# Patient Record
Sex: Female | Born: 1945 | Race: White | Hispanic: No | Marital: Married | Smoking: Current every day smoker
Health system: Southern US, Community
[De-identification: ages and names within clinical notes are randomized; demographics above are authoritative.]

## PROBLEM LIST (undated history)

## (undated) DIAGNOSIS — N189 Chronic kidney disease, unspecified: Secondary | ICD-10-CM

## (undated) DIAGNOSIS — M1990 Unspecified osteoarthritis, unspecified site: Secondary | ICD-10-CM

## (undated) DIAGNOSIS — I10 Essential (primary) hypertension: Secondary | ICD-10-CM

## (undated) DIAGNOSIS — R51 Headache: Secondary | ICD-10-CM

## (undated) HISTORY — PX: BACK SURGERY: SHX140

## (undated) HISTORY — PX: TONSILLECTOMY: SUR1361

## (undated) HISTORY — PX: ANKLE ARTHODESIS W/ ARTHROSCOPY: SUR63

## (undated) HISTORY — DX: Essential (primary) hypertension: I10

## (undated) HISTORY — PX: ABDOMINAL HYSTERECTOMY: SHX81

## (undated) HISTORY — PX: OTHER SURGICAL HISTORY: SHX169

---

## 1999-01-05 ENCOUNTER — Other Ambulatory Visit: Admission: RE | Admit: 1999-01-05 | Discharge: 1999-01-05 | Payer: Self-pay | Admitting: Internal Medicine

## 1999-04-06 ENCOUNTER — Ambulatory Visit (HOSPITAL_COMMUNITY): Admission: RE | Admit: 1999-04-06 | Discharge: 1999-04-06 | Payer: Self-pay | Admitting: Cardiovascular Disease

## 1999-08-12 ENCOUNTER — Encounter: Admission: RE | Admit: 1999-08-12 | Discharge: 1999-08-12 | Payer: Self-pay | Admitting: Internal Medicine

## 1999-08-12 ENCOUNTER — Encounter: Payer: Self-pay | Admitting: Internal Medicine

## 2000-03-21 ENCOUNTER — Other Ambulatory Visit: Admission: RE | Admit: 2000-03-21 | Discharge: 2000-03-21 | Payer: Self-pay | Admitting: Internal Medicine

## 2000-03-23 ENCOUNTER — Encounter: Payer: Self-pay | Admitting: Internal Medicine

## 2000-03-23 ENCOUNTER — Encounter: Admission: RE | Admit: 2000-03-23 | Discharge: 2000-03-23 | Payer: Self-pay | Admitting: Internal Medicine

## 2000-09-04 ENCOUNTER — Encounter: Payer: Self-pay | Admitting: Internal Medicine

## 2000-09-04 ENCOUNTER — Encounter: Admission: RE | Admit: 2000-09-04 | Discharge: 2000-09-04 | Payer: Self-pay | Admitting: Internal Medicine

## 2001-03-26 ENCOUNTER — Other Ambulatory Visit: Admission: RE | Admit: 2001-03-26 | Discharge: 2001-03-26 | Payer: Self-pay | Admitting: Obstetrics and Gynecology

## 2001-09-06 ENCOUNTER — Encounter: Payer: Self-pay | Admitting: Internal Medicine

## 2001-09-06 ENCOUNTER — Encounter: Admission: RE | Admit: 2001-09-06 | Discharge: 2001-09-06 | Payer: Self-pay | Admitting: Internal Medicine

## 2002-06-13 ENCOUNTER — Encounter: Payer: Self-pay | Admitting: Internal Medicine

## 2002-06-13 ENCOUNTER — Encounter: Admission: RE | Admit: 2002-06-13 | Discharge: 2002-06-13 | Payer: Self-pay | Admitting: Internal Medicine

## 2002-07-09 ENCOUNTER — Encounter: Payer: Self-pay | Admitting: Internal Medicine

## 2002-07-09 ENCOUNTER — Encounter: Admission: RE | Admit: 2002-07-09 | Discharge: 2002-07-09 | Payer: Self-pay | Admitting: Internal Medicine

## 2002-07-25 ENCOUNTER — Ambulatory Visit (HOSPITAL_COMMUNITY): Admission: RE | Admit: 2002-07-25 | Discharge: 2002-07-25 | Payer: Self-pay | Admitting: Gastroenterology

## 2002-09-09 ENCOUNTER — Encounter: Admission: RE | Admit: 2002-09-09 | Discharge: 2002-09-09 | Payer: Self-pay | Admitting: Internal Medicine

## 2002-09-09 ENCOUNTER — Encounter: Payer: Self-pay | Admitting: Internal Medicine

## 2003-05-15 ENCOUNTER — Other Ambulatory Visit: Admission: RE | Admit: 2003-05-15 | Discharge: 2003-05-15 | Payer: Self-pay | Admitting: Obstetrics and Gynecology

## 2003-05-21 ENCOUNTER — Encounter: Admission: RE | Admit: 2003-05-21 | Discharge: 2003-05-21 | Payer: Self-pay | Admitting: Internal Medicine

## 2003-09-30 ENCOUNTER — Encounter: Admission: RE | Admit: 2003-09-30 | Discharge: 2003-09-30 | Payer: Self-pay | Admitting: Obstetrics and Gynecology

## 2004-05-15 ENCOUNTER — Emergency Department (HOSPITAL_COMMUNITY): Admission: EM | Admit: 2004-05-15 | Discharge: 2004-05-15 | Payer: Self-pay | Admitting: Emergency Medicine

## 2004-05-18 ENCOUNTER — Other Ambulatory Visit: Admission: RE | Admit: 2004-05-18 | Discharge: 2004-05-18 | Payer: Self-pay | Admitting: Obstetrics and Gynecology

## 2004-05-27 ENCOUNTER — Encounter: Admission: RE | Admit: 2004-05-27 | Discharge: 2004-05-27 | Payer: Self-pay | Admitting: Family Medicine

## 2005-06-17 ENCOUNTER — Encounter: Admission: RE | Admit: 2005-06-17 | Discharge: 2005-06-17 | Payer: Self-pay | Admitting: Internal Medicine

## 2006-02-23 ENCOUNTER — Other Ambulatory Visit: Admission: RE | Admit: 2006-02-23 | Discharge: 2006-02-23 | Payer: Self-pay | Admitting: Obstetrics and Gynecology

## 2006-09-05 ENCOUNTER — Encounter: Admission: RE | Admit: 2006-09-05 | Discharge: 2006-09-05 | Payer: Self-pay | Admitting: Obstetrics and Gynecology

## 2006-12-10 ENCOUNTER — Inpatient Hospital Stay (HOSPITAL_COMMUNITY): Admission: EM | Admit: 2006-12-10 | Discharge: 2006-12-12 | Payer: Self-pay | Admitting: *Deleted

## 2007-02-26 ENCOUNTER — Other Ambulatory Visit: Admission: RE | Admit: 2007-02-26 | Discharge: 2007-02-26 | Payer: Self-pay | Admitting: Obstetrics and Gynecology

## 2007-10-02 ENCOUNTER — Encounter: Admission: RE | Admit: 2007-10-02 | Discharge: 2007-10-02 | Payer: Self-pay | Admitting: Obstetrics and Gynecology

## 2007-10-04 ENCOUNTER — Encounter: Admission: RE | Admit: 2007-10-04 | Discharge: 2007-10-04 | Payer: Self-pay | Admitting: Obstetrics and Gynecology

## 2008-02-27 ENCOUNTER — Other Ambulatory Visit: Admission: RE | Admit: 2008-02-27 | Discharge: 2008-02-27 | Payer: Self-pay | Admitting: Obstetrics and Gynecology

## 2008-10-17 ENCOUNTER — Encounter: Admission: RE | Admit: 2008-10-17 | Discharge: 2008-10-17 | Payer: Self-pay | Admitting: Obstetrics and Gynecology

## 2009-02-17 ENCOUNTER — Encounter: Admission: RE | Admit: 2009-02-17 | Discharge: 2009-02-17 | Payer: Self-pay | Admitting: Internal Medicine

## 2009-03-04 ENCOUNTER — Other Ambulatory Visit: Admission: RE | Admit: 2009-03-04 | Discharge: 2009-03-04 | Payer: Self-pay | Admitting: Obstetrics and Gynecology

## 2009-04-15 ENCOUNTER — Encounter: Admission: RE | Admit: 2009-04-15 | Discharge: 2009-04-15 | Payer: Self-pay | Admitting: Internal Medicine

## 2009-05-07 ENCOUNTER — Ambulatory Visit (HOSPITAL_COMMUNITY): Admission: RE | Admit: 2009-05-07 | Discharge: 2009-05-08 | Payer: Self-pay | Admitting: Neurosurgery

## 2009-05-07 ENCOUNTER — Encounter (INDEPENDENT_AMBULATORY_CARE_PROVIDER_SITE_OTHER): Payer: Self-pay | Admitting: Neurosurgery

## 2010-02-17 ENCOUNTER — Encounter: Admission: RE | Admit: 2010-02-17 | Discharge: 2010-02-17 | Payer: Self-pay | Admitting: Internal Medicine

## 2010-07-11 LAB — CBC
Hemoglobin: 14.2 g/dL (ref 12.0–15.0)
MCHC: 34.7 g/dL (ref 30.0–36.0)
MCV: 96 fL (ref 78.0–100.0)
RBC: 4.26 MIL/uL (ref 3.87–5.11)
WBC: 9.5 10*3/uL (ref 4.0–10.5)

## 2010-07-11 LAB — URINALYSIS, ROUTINE W REFLEX MICROSCOPIC
Leukocytes, UA: NEGATIVE
Nitrite: NEGATIVE
Specific Gravity, Urine: 1.006 (ref 1.005–1.030)
Urobilinogen, UA: 0.2 mg/dL (ref 0.0–1.0)
pH: 7 (ref 5.0–8.0)

## 2010-07-11 LAB — BASIC METABOLIC PANEL
CO2: 27 mEq/L (ref 19–32)
Chloride: 100 mEq/L (ref 96–112)
Creatinine, Ser: 0.61 mg/dL (ref 0.4–1.2)
GFR calc Af Amer: >60 mL/min (ref >60)
Sodium: 135 mEq/L (ref 135–145)

## 2010-07-11 LAB — DIFFERENTIAL
Basophils Relative: 0 % (ref 0–1)
Lymphs Abs: 3.3 10*3/uL (ref 0.7–4.0)
Monocytes Absolute: 0.8 10*3/uL (ref 0.1–1.0)
Monocytes Relative: 8 % (ref 3–12)
Neutro Abs: 5.3 10*3/uL (ref 1.7–7.7)
Neutrophils Relative %: 56 % (ref 43–77)

## 2010-07-11 LAB — URINE MICROSCOPIC-ADD ON

## 2010-07-11 LAB — PROTIME-INR: INR: 0.91 (ref 0.00–1.49)

## 2010-07-11 LAB — APTT: aPTT: 30 seconds (ref 24–37)

## 2010-09-07 NOTE — Op Note (Signed)
Wendy Ortega, Wendy Ortega               ACCOUNT NO.:  [id number]   MEDICAL RECORD NO.:  [id number]          PATIENT TYPE:  INP   LOCATION:  1617                         FACILITY:  [facility]   PHYSICIAN:  Almedia Balls. Ranell Patrick, M.D. DATE OF BIRTH:  1945-05-02   DATE OF PROCEDURE:  12/11/2006  DATE OF DISCHARGE:                               OPERATIVE REPORT   PREOPERATIVE DIAGNOSIS:  Right displaced ankle fracture.   POSTOPERATIVE DIAGNOSIS:  Right displaced ankle fracture.   PROCEDURE PERFORMED:  Open reduction and internal fixation of right  bimalleolar ankle fracture.   ATTENDING SURGEON:  Almedia Balls. Ranell Patrick, M.D.   ASSISTANT:  Donnie Coffin. Dixon, P.A.-C.   ANESTHESIA:  General anesthesia was used.   ESTIMATED BLOOD LOSS:  Zero.   TOURNIQUET TIME:  46 minutes at 300 mmHg.   INSTRUMENT COUNTS:  Correct.   COMPLICATIONS:  There were no complications.   Perioperative antibiotics were given.   INDICATIONS:  The patient is a 65 year old female who suffered a  fracture dislocation of her right ankle  The patient presented to [facility] Emergency Room for evaluation and was noted to have a displaced  fibular fracture with a posterior malleolar fracture and a subluxed,  almost dislocated ankle.  The patient was closed reduced in the  emergency room and was placed in a splint and now presents for  definitive fixation of her unstable ankle fracture.  Informed consent  was obtained.   DESCRIPTION OF PROCEDURE:  After an adequate level of anesthesia was  achieved, the patient was placed on a regulation operating room table.  Nonsterile tourniquet was placed on the right proximal thigh.  The right  leg was thoroughly prepped and draped in the usual manner.  After  exsanguination of the limb using an Esmarch bandage, we elevated the  tourniquet to 300 mmHg.  A longitudinal lateral incision was carried out  over the distal fibula.  Dissection was carried sharply down through  subcutaneous tissues.   The sural nerve was identified as taking an  anomalous course.  This coursed directly across the fracture site and  across the incision, going anteriorly.  This was protected anteriorly  during the surgery.  We identified the fracture site, cleaned it of soft  tissue debris, thoroughly irrigated, removed soft tissue from the  anterolateral gutter of the ankle joint.  We then reduced the fracture  under direct visualization, placing a crab-claw clamp to hold the  fracture provisionally in place.  We then placed an anterior-to-  posterior compression screw using AO technique using a 3.5 mm cortex  screw, overdrilling the proximal fragment.  With this in place, we  placed an 8-hole, one-third tubular plate on the lateral side of the  fibula to serve as a neutralization plate.  We had three cortical screws  proximally and three cancellous screws distally for good  purchase.  We then thoroughly irrigated, checked final x-rays both in  the AP and lateral plane.  Happy with the position of the mortise and  the hardware in the fracture site, we went ahead and closed the wound  with a layered closure with 2-0 Vicryl followed by staples applied.  The  patient was taken to the recovery room in stable condition.      Almedia Balls. Ranell Patrick, M.D.  Electronically Signed     SRN/MEDQ  D:  12/11/2006  T:  12/12/2006  Job:  [id number]

## 2010-09-07 NOTE — Discharge Summary (Signed)
Wendy Ortega, Wendy Ortega               ACCOUNT NO.:  [id number]   MEDICAL RECORD NO.:  [id number]          PATIENT TYPE:  INP   LOCATION:  1617                         FACILITY:  [facility]   PHYSICIAN:  Almedia Balls. Ranell Patrick, M.D. DATE OF BIRTH:  11/10/1945   DATE OF ADMISSION:  12/10/2006  DATE OF DISCHARGE:  12/12/2006                               DISCHARGE SUMMARY   ADMISSION DIAGNOSIS:  Right displaced fibular fracture.   DISCHARGE DIAGNOSIS:  Right displaced ankle fracture, status post open  reduction internal fixation.   HISTORY OF PRESENT ILLNESS:  The patient is a 65 year old female who  suffered a fracture dislocation of her right ankle on August 17, after  slipping and falling and having a foot inversion.  The patient was seen  in the emergency department and admitted for surgical management of this  right ankle.   PROCEDURE:  The patient had a right ankle open reduction internal  fixation by Dr. Malon Kindle, on December 11, 2006, assistant Standley Dakins, PA-C.  General anesthesia was used.  Estimated blood loss was  minimal.  No complications.   HOSPITAL COURSE:  The patient admitted on December 10, 2006, after  sustaining a fracture dislocation.  The patient did have a closed  reduction in the emergency department with greatly improved fibular  fracture, but still unstable ankle thus needing surgical management for  fixation.  The patient had the above-stated procedure on December 11, 2006, which she tolerated well.  After adequate time in the post  anesthesia care unit, she was transferred up to [address].  On postop day  #1, the patient complained about some mild pain to the right ankle, but  was able to progress with physical therapy to work with a walker before  discharge home.  The patient is to be discharged home on December 12, 2006.   DIET:  Diet is regular.   CONDITION ON DISCHARGE:  Stable.   FOLLOW UP:  The patient is to follow back up with Dr. Malon Kindle in 2  weeks.   ALLERGIES:  TETRACYCLINE AND ERYTHROMYCIN.   DISCHARGE MEDICATIONS:  1. Percocet 5/325 1-2 tablets q.4-6 h. p.r.n. pain.  2. Robaxin 500 mg 1 tablet p.o. q.6 h.      Thomas B. Durwin Nora, P.A.      Almedia Balls. Ranell Patrick, M.D.  Electronically Signed    TBD/MEDQ  D:  12/12/2006  T:  12/12/2006  Job:  [id number]

## 2011-01-13 ENCOUNTER — Other Ambulatory Visit: Payer: Self-pay | Admitting: Internal Medicine

## 2011-01-13 DIAGNOSIS — Z1231 Encounter for screening mammogram for malignant neoplasm of breast: Secondary | ICD-10-CM

## 2011-02-04 LAB — CBC
HCT: 39
Hemoglobin: 13.6
MCV: 93.7
RBC: 4.17
WBC: 8.4

## 2011-02-04 LAB — URINE MICROSCOPIC-ADD ON

## 2011-02-04 LAB — DIFFERENTIAL
Eosinophils Absolute: 0.1
Eosinophils Relative: 1
Lymphs Abs: 2.5
Monocytes Relative: 6

## 2011-02-04 LAB — URINALYSIS, ROUTINE W REFLEX MICROSCOPIC
Bilirubin Urine: NEGATIVE
Specific Gravity, Urine: 1.009
Urobilinogen, UA: 0.2
pH: 6.5

## 2011-02-04 LAB — BASIC METABOLIC PANEL
Chloride: 107
GFR calc Af Amer: >60
Potassium: 4.4

## 2011-02-04 LAB — PROTIME-INR
INR: 0.9
Prothrombin Time: 11.8

## 2011-02-04 LAB — APTT: aPTT: 30

## 2011-02-23 ENCOUNTER — Ambulatory Visit
Admission: RE | Admit: 2011-02-23 | Discharge: 2011-02-23 | Disposition: A | Discharge status: Home / Self Care | Payer: BC Managed Care – PPO | Source: Ambulatory Visit | Attending: Internal Medicine | Admitting: Internal Medicine

## 2011-02-23 DIAGNOSIS — Z1231 Encounter for screening mammogram for malignant neoplasm of breast: Secondary | ICD-10-CM

## 2011-05-26 ENCOUNTER — Other Ambulatory Visit: Payer: Self-pay | Admitting: Dermatology

## 2011-06-06 ENCOUNTER — Other Ambulatory Visit: Payer: Self-pay | Admitting: Dermatology

## 2011-12-07 ENCOUNTER — Other Ambulatory Visit: Payer: Self-pay | Admitting: Dermatology

## 2012-04-10 ENCOUNTER — Other Ambulatory Visit: Payer: Self-pay | Admitting: Internal Medicine

## 2012-04-10 DIAGNOSIS — Z1231 Encounter for screening mammogram for malignant neoplasm of breast: Secondary | ICD-10-CM

## 2012-04-10 DIAGNOSIS — R3129 Other microscopic hematuria: Secondary | ICD-10-CM

## 2012-04-30 ENCOUNTER — Ambulatory Visit
Admission: RE | Admit: 2012-04-30 | Discharge: 2012-04-30 | Disposition: A | Discharge status: Home / Self Care | Payer: Medicare Other | Source: Ambulatory Visit | Attending: Internal Medicine | Admitting: Internal Medicine

## 2012-04-30 DIAGNOSIS — R3129 Other microscopic hematuria: Secondary | ICD-10-CM

## 2012-05-21 ENCOUNTER — Ambulatory Visit
Admission: RE | Admit: 2012-05-21 | Discharge: 2012-05-21 | Disposition: A | Discharge status: Home / Self Care | Payer: Medicare Other | Source: Ambulatory Visit | Attending: Internal Medicine | Admitting: Internal Medicine

## 2012-05-21 DIAGNOSIS — Z1231 Encounter for screening mammogram for malignant neoplasm of breast: Secondary | ICD-10-CM

## 2013-09-18 ENCOUNTER — Other Ambulatory Visit: Payer: Self-pay | Admitting: Gastroenterology

## 2013-09-30 ENCOUNTER — Encounter (INDEPENDENT_AMBULATORY_CARE_PROVIDER_SITE_OTHER): Payer: Self-pay

## 2013-09-30 ENCOUNTER — Other Ambulatory Visit: Payer: Self-pay | Admitting: Internal Medicine

## 2013-09-30 ENCOUNTER — Ambulatory Visit
Admission: RE | Admit: 2013-09-30 | Discharge: 2013-09-30 | Disposition: A | Discharge status: Home / Self Care | Payer: Medicare Other | Source: Ambulatory Visit | Attending: Internal Medicine | Admitting: Internal Medicine

## 2013-09-30 DIAGNOSIS — F17200 Nicotine dependence, unspecified, uncomplicated: Secondary | ICD-10-CM

## 2013-10-01 ENCOUNTER — Encounter (INDEPENDENT_AMBULATORY_CARE_PROVIDER_SITE_OTHER): Payer: Self-pay | Admitting: General Surgery

## 2013-10-01 ENCOUNTER — Ambulatory Visit (INDEPENDENT_AMBULATORY_CARE_PROVIDER_SITE_OTHER): Payer: Medicare Other | Admitting: General Surgery

## 2013-10-01 VITALS — BP 126/78 | HR 64 | Temp 97.8°F | Ht 64.0 in | Wt 161.0 lb

## 2013-10-01 DIAGNOSIS — K645 Perianal venous thrombosis: Secondary | ICD-10-CM | POA: Insufficient documentation

## 2013-10-01 NOTE — Progress Notes (Signed)
Patient ID: Wendy Ortega, female   DOB: 10-Aug-1945, 68 y.o.   MRN: [id number] Subjective:    HPI The patient comes in with a chronically thrombosed external hemorrhoid.  Review of Systems No bleeding. Occasional itching. No pain.    Objective:   Physical Exam On examination at approximately the 2:00 position with 12:00 being directly posterior the patient has a chronically thrombosed external hemorrhoid.  On anoscopy there was no associated internal hemorrhoid.     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     Plan:     Sitz baths at least twice a day. Reassessment in one month if she continues to be symptomatic becomes more symptomatic.

## 2013-10-01 NOTE — Progress Notes (Deleted)
Subjective:     Patient ID: Wendy Ortega, female   DOB: 1945-08-28, 68 y.o.   MRN: [id number]  HPI   Review of Systems     Objective:   Physical Exam     Assessment:     ***    Plan:     ***

## 2013-11-18 ENCOUNTER — Encounter (HOSPITAL_COMMUNITY): Payer: Self-pay | Admitting: Pharmacy Technician

## 2013-11-28 ENCOUNTER — Encounter (HOSPITAL_COMMUNITY): Payer: Self-pay | Admitting: *Deleted

## 2013-12-10 ENCOUNTER — Ambulatory Visit (HOSPITAL_COMMUNITY)
Admission: RE | Admit: 2013-12-10 | Discharge: 2013-12-10 | Disposition: A | Discharge status: Home / Self Care | Payer: Medicare Other | Source: Ambulatory Visit | Attending: Gastroenterology | Admitting: Gastroenterology

## 2013-12-10 ENCOUNTER — Encounter (HOSPITAL_COMMUNITY): Payer: Self-pay | Admitting: Registered Nurse

## 2013-12-10 ENCOUNTER — Ambulatory Visit (HOSPITAL_COMMUNITY): Payer: Medicare Other | Admitting: Registered Nurse

## 2013-12-10 ENCOUNTER — Encounter (HOSPITAL_COMMUNITY): Payer: Medicare Other | Admitting: Registered Nurse

## 2013-12-10 ENCOUNTER — Encounter (HOSPITAL_COMMUNITY)
Admission: RE | Disposition: A | Discharge status: Home / Self Care | Payer: Self-pay | Source: Ambulatory Visit | Attending: Gastroenterology

## 2013-12-10 DIAGNOSIS — I10 Essential (primary) hypertension: Secondary | ICD-10-CM | POA: Insufficient documentation

## 2013-12-10 DIAGNOSIS — Z83719 Family history of colon polyps, unspecified: Secondary | ICD-10-CM | POA: Insufficient documentation

## 2013-12-10 DIAGNOSIS — D126 Benign neoplasm of colon, unspecified: Secondary | ICD-10-CM | POA: Diagnosis not present

## 2013-12-10 DIAGNOSIS — Z90710 Acquired absence of both cervix and uterus: Secondary | ICD-10-CM | POA: Insufficient documentation

## 2013-12-10 DIAGNOSIS — Z8371 Family history of colonic polyps: Secondary | ICD-10-CM | POA: Insufficient documentation

## 2013-12-10 DIAGNOSIS — Z79899 Other long term (current) drug therapy: Secondary | ICD-10-CM | POA: Insufficient documentation

## 2013-12-10 DIAGNOSIS — Z800 Family history of malignant neoplasm of digestive organs: Secondary | ICD-10-CM | POA: Insufficient documentation

## 2013-12-10 DIAGNOSIS — Z1211 Encounter for screening for malignant neoplasm of colon: Secondary | ICD-10-CM | POA: Diagnosis not present

## 2013-12-10 DIAGNOSIS — F17200 Nicotine dependence, unspecified, uncomplicated: Secondary | ICD-10-CM | POA: Insufficient documentation

## 2013-12-10 HISTORY — DX: Headache: R51

## 2013-12-10 HISTORY — DX: Chronic kidney disease, unspecified: N18.9

## 2013-12-10 HISTORY — DX: Unspecified osteoarthritis, unspecified site: M19.90

## 2013-12-10 HISTORY — PX: COLONOSCOPY WITH PROPOFOL: SHX5780

## 2013-12-10 SURGERY — COLONOSCOPY WITH PROPOFOL
Anesthesia: Monitor Anesthesia Care

## 2013-12-10 MED ORDER — LIDOCAINE HCL (CARDIAC) 20 MG/ML IV SOLN
INTRAVENOUS | Status: AC
  Filled 2013-12-10: qty 5

## 2013-12-10 MED ORDER — ATROPINE SULFATE 0.4 MG/ML IJ SOLN
INTRAMUSCULAR | Status: AC
  Filled 2013-12-10: qty 2

## 2013-12-10 MED ORDER — PROPOFOL 10 MG/ML IV BOLUS
INTRAVENOUS | Status: DC | PRN
  Administered 2013-12-10 (×3): 20 mg via INTRAVENOUS

## 2013-12-10 MED ORDER — LACTATED RINGERS IV SOLN
INTRAVENOUS | Status: DC | PRN
  Administered 2013-12-10: 08:00:00 via INTRAVENOUS

## 2013-12-10 MED ORDER — LIDOCAINE HCL (CARDIAC) 20 MG/ML IV SOLN
INTRAVENOUS | Status: DC | PRN
  Administered 2013-12-10: 100 mg via INTRAVENOUS

## 2013-12-10 MED ORDER — MIDAZOLAM HCL 5 MG/5ML IJ SOLN
INTRAMUSCULAR | Status: DC | PRN
  Administered 2013-12-10: 2 mg via INTRAVENOUS

## 2013-12-10 MED ORDER — EPHEDRINE SULFATE 50 MG/ML IJ SOLN
INTRAMUSCULAR | Status: AC
  Filled 2013-12-10: qty 1

## 2013-12-10 MED ORDER — SODIUM CHLORIDE 0.9 % IV SOLN: INTRAVENOUS | Status: DC

## 2013-12-10 MED ORDER — PROPOFOL INFUSION 10 MG/ML OPTIME
INTRAVENOUS | Status: DC | PRN
  Administered 2013-12-10: 75 ug/kg/min via INTRAVENOUS

## 2013-12-10 MED ORDER — MIDAZOLAM HCL 2 MG/2ML IJ SOLN
INTRAMUSCULAR | Status: AC
  Filled 2013-12-10: qty 2

## 2013-12-10 MED ORDER — PROPOFOL 10 MG/ML IV BOLUS
INTRAVENOUS | Status: AC
  Filled 2013-12-10: qty 20

## 2013-12-10 MED ORDER — SODIUM CHLORIDE 0.9 % IJ SOLN
INTRAMUSCULAR | Status: AC
  Filled 2013-12-10: qty 10

## 2013-12-10 SURGICAL SUPPLY — 22 items

## 2013-12-10 NOTE — Op Note (Signed)
Procedure: Screening colonoscopy. Colonoscopy with removal of a 7 mm colon polyp in 2000. Normal screening colonoscopies performed in 2004 and 2009. Father diagnosed with colon cancer after age 67. Brother diagnosed with colon polyps.  Endoscopist: Earle Gell  Premedication: Propofol administered by anesthesia  Procedure: The patient was placed in the left lateral decubitus position. Anal inspection and digital rectal exam were normal. The Pentax pediatric colonoscope was introduced into the rectum and advanced to the cecum. A normal-appearing appendiceal orifice and ileocecal valve were identified. Colonic preparation for the exam today was good.  Rectum. Normal. Retroflexed view of the distal rectum normal  Sigmoid colon and descending colon. Normal  Splenic flexure. Normal  Transverse colon. From the mid transverse colon, a 3 mm sessile polyp was removed with the cold snare, a 5 mm sessile polyp was removed with the cold snare, and a 6 mm sessile polyp was removed with the hot snare.  Hepatic flexure. Normal  Ascending colon. From the proximal ascending colon, a 5 mm sessile polyp was removed with the cold snare  Cecum and ileocecal valve. Normal  Assessment: A 5 mm sessile polyp was removed from the proximal ascending colon and three small sessile polyps were removed from the mid transverse colon.  Recommendation: Schedule repeat colonoscopy in 5 years.

## 2013-12-10 NOTE — Discharge Instructions (Signed)
Colonoscopy, Care After °These instructions give you information on caring for yourself after your procedure. Your doctor may also give you more specific instructions. Call your doctor if you have any problems or questions after your procedure. °HOME CARE °· Do not drive for 24 hours. °· Do not sign important papers or use machinery for 24 hours. °· You may shower. °· You may go back to your usual activities, but go slower for the first 24 hours. °· Take rest breaks often during the first 24 hours. °· Walk around or use warm packs on your belly (abdomen) if you have belly cramping or gas. °· Drink enough fluids to keep your pee (urine) clear or pale yellow. °· Resume your normal diet. Avoid heavy or fried foods. °· Avoid drinking alcohol for 24 hours or as told by your doctor. °· Only take medicines as told by your doctor. °If a tissue sample (biopsy) was taken during the procedure:  °· Do not take aspirin or blood thinners for 7 days, or as told by your doctor. °· Do not drink alcohol for 7 days, or as told by your doctor. °· Eat soft foods for the first 24 hours. °GET HELP IF: °You still have a small amount of blood in your poop (stool) 2-3 days after the procedure. °GET HELP RIGHT AWAY IF: °· You have more than a small amount of blood in your poop. °· You see clumps of tissue (blood clots) in your poop. °· Your belly is puffy (swollen). °· You feel sick to your stomach (nauseous) or throw up (vomit). °· You have a fever. °· You have belly pain that gets worse and medicine does not help. °MAKE SURE YOU: °· Understand these instructions. °· Will watch your condition. °· Will get help right away if you are not doing well or get worse. °Document Released: 05/14/2010 Document Revised: 04/16/2013 Document Reviewed: 12/17/2012 °Exit[employer]® Patient Information ©2015 [employer]. This information is not intended to replace advice given to you by your health care provider. Make sure you discuss any questions you have with  your health care provider. ° °

## 2013-12-10 NOTE — Anesthesia Postprocedure Evaluation (Signed)
  Anesthesia Post-op Note  Patient: Wendy Ortega  Procedure(s) Performed: Procedure(s) (LRB): COLONOSCOPY WITH PROPOFOL (N/A)  Patient Location: PACU  Anesthesia Type: MAC  Level of Consciousness: awake and alert   Airway and Oxygen Therapy: Patient Spontanous Breathing  Post-op Pain: mild  Post-op Assessment: Post-op Vital signs reviewed, Patient's Cardiovascular Status Stable, Respiratory Function Stable, Patent Airway and No signs of Nausea or vomiting  Last Vitals:  Filed Vitals:   12/10/13 0840  BP: 126/63  Pulse: 57  Temp: 36.6 C  Resp: 17    Post-op Vital Signs: stable   Complications: No apparent anesthesia complications

## 2013-12-10 NOTE — Anesthesia Preprocedure Evaluation (Addendum)
Anesthesia Evaluation  Patient identified by MRN, date of birth, ID band Patient awake    Reviewed: Allergy & Precautions, H&P , NPO status , Patient's Chart, lab work & pertinent test results  Airway Mallampati: II TM Distance: >3 FB Neck ROM: Full    Dental no notable dental hx.    Pulmonary Current Smoker,  breath sounds clear to auscultation  Pulmonary exam normal       Cardiovascular hypertension, Pt. on medications Rhythm:Regular Rate:Normal     Neuro/Psych negative neurological ROS  negative psych ROS   GI/Hepatic negative GI ROS, Neg liver ROS,   Endo/Other  negative endocrine ROS  Renal/GU negative Renal ROS  negative genitourinary   Musculoskeletal negative musculoskeletal ROS (+)   Abdominal   Peds negative pediatric ROS (+)  Hematology negative hematology ROS (+)   Anesthesia Other Findings   Reproductive/Obstetrics negative OB ROS                         Anesthesia Physical Anesthesia Plan  ASA: II  Anesthesia Plan: MAC   Post-op Pain Management:    Induction: Intravenous  Airway Management Planned: Nasal Cannula and Simple Face Mask  Additional Equipment:   Intra-op Plan:   Post-operative Plan:   Informed Consent: I have reviewed the patients History and Physical, chart, labs and discussed the procedure including the risks, benefits and alternatives for the proposed anesthesia with the patient or authorized representative who has indicated his/her understanding and acceptance.   Dental advisory given  Plan Discussed with: CRNA and Surgeon  Anesthesia Plan Comments:        Anesthesia Quick Evaluation

## 2013-12-10 NOTE — Transfer of Care (Signed)
Immediate Anesthesia Transfer of Care Note  Patient: Wendy Ortega  Procedure(s) Performed: Procedure(s): COLONOSCOPY WITH PROPOFOL (N/A)  Patient Location: PACU and Endoscopy Unit  Anesthesia Type:MAC  Level of Consciousness: awake, alert , oriented and patient cooperative  Airway & Oxygen Therapy: Patient Spontanous Breathing and Patient connected to face mask oxygen  Post-op Assessment: Report given to PACU RN, Post -op Vital signs reviewed and stable and Patient moving all extremities  Post vital signs: Reviewed and stable  Complications: No apparent anesthesia complications

## 2013-12-10 NOTE — H&P (Signed)
  Procedure: Repeat screening colonoscopy. Colonoscopy with removal of a 7 mm colon polyp in 2000. Normal screening colonoscopies in 2004 and 2009. Father diagnosed with colon cancer after age 67. Brother has had colon polyps removed.  History: The patient is a 67 year old female born for/14/1947. She is scheduled to undergo a screening colonoscopy with polypectomy to prevent colon cancer.  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 Irritable bowel syndrome. Hyperplastic colon polyps. Tonsillectomy. Lasik eye  surgery. Urethral stricture dilation. Hysterectomy. Ankle surgery. Lumbar laminectomy.  Medication allergies: Erythromycin.  Exam: The patient is alert and lying comfortably on the endoscopy stretcher. Abdomen is soft and nontender to palpation. Lungs are clear to auscultation. Cardiac exam reveals a regular rhythm.  Plan: Proceed with screening colonoscopy

## 2013-12-11 ENCOUNTER — Encounter (HOSPITAL_COMMUNITY): Payer: Self-pay | Admitting: Gastroenterology

## 2015-12-17 ENCOUNTER — Other Ambulatory Visit: Payer: Self-pay | Admitting: Internal Medicine

## 2015-12-17 DIAGNOSIS — R319 Hematuria, unspecified: Secondary | ICD-10-CM

## 2015-12-21 ENCOUNTER — Ambulatory Visit
Admission: RE | Admit: 2015-12-21 | Discharge: 2015-12-21 | Disposition: A | Discharge status: Home / Self Care | Payer: Medicare Other | Source: Ambulatory Visit | Attending: Internal Medicine | Admitting: Internal Medicine

## 2015-12-21 DIAGNOSIS — R319 Hematuria, unspecified: Secondary | ICD-10-CM

## 2015-12-23 ENCOUNTER — Other Ambulatory Visit: Payer: Self-pay | Admitting: Acute Care

## 2015-12-23 DIAGNOSIS — F17210 Nicotine dependence, cigarettes, uncomplicated: Secondary | ICD-10-CM

## 2016-01-18 ENCOUNTER — Encounter: Payer: Self-pay | Admitting: Acute Care

## 2016-01-18 ENCOUNTER — Ambulatory Visit (INDEPENDENT_AMBULATORY_CARE_PROVIDER_SITE_OTHER): Payer: Medicare Other | Admitting: Acute Care

## 2016-01-18 ENCOUNTER — Ambulatory Visit (INDEPENDENT_AMBULATORY_CARE_PROVIDER_SITE_OTHER)
Admission: RE | Admit: 2016-01-18 | Discharge: 2016-01-18 | Disposition: A | Discharge status: Home / Self Care | Payer: Medicare Other | Source: Ambulatory Visit | Attending: Acute Care | Admitting: Acute Care

## 2016-01-18 DIAGNOSIS — F17210 Nicotine dependence, cigarettes, uncomplicated: Secondary | ICD-10-CM | POA: Diagnosis not present

## 2016-01-18 DIAGNOSIS — Z87891 Personal history of nicotine dependence: Secondary | ICD-10-CM | POA: Diagnosis not present

## 2016-01-18 NOTE — Progress Notes (Signed)
Shared Decision Making Visit Lung Cancer Screening Program [phone])   Eligibility:  Age 70 y.o.  Pack Years Smoking History Calculation 42.5 pack year smoking history (# packs/per year x # years smoked)  Recent History of coughing up blood  no  Unexplained weight loss? no ( >Than 15 pounds within the last 6 months )  Prior History Lung / other cancer no (Diagnosis within the last 5 years already requiring surveillance chest CT Scans).  Smoking Status Current Smoker  Former Smokers: Years since quit: NA  Quit Date: NA  Visit Components:  Discussion included one or more decision making aids. yes  Discussion included risk/benefits of screening. yes  Discussion included potential follow up diagnostic testing for abnormal scans. yes  Discussion included meaning and risk of over diagnosis. yes  Discussion included meaning and risk of False Positives. yes  Discussion included meaning of total radiation exposure. yes  Counseling Included:  Importance of adherence to annual lung cancer LDCT screening. yes  Impact of comorbidities on ability to participate in the program. yes  Ability and willingness to under diagnostic treatment. yes  Smoking Cessation Counseling:  Current Smokers:   Discussed importance of smoking cessation. yes  Information about tobacco cessation classes and interventions provided to patient. yes  Patient provided with "ticket" for LDCT Scan. yes  Symptomatic Patient. no  Counseling: NA  Diagnosis Code: Tobacco Use Z72.0  Asymptomatic Patient yes  Counseling (Intermediate counseling: > three minutes counseling) [id number]  Former Smokers:   Discussed the importance of maintaining cigarette abstinence. yes  Diagnosis Code: Personal History of Nicotine Dependence. [id number]  Information about tobacco cessation classes and interventions provided to patient. Yes  Patient provided with "ticket" for LDCT Scan. yes  Written Order for Lung Cancer  Screening with LDCT placed in Epic. Yes (CT Chest Lung Cancer Screening Low Dose W/O CM) [id number] Z12.2-Screening of respiratory organs Z87.891-Personal history of nicotine dependence  I have spent 20 minutes of face to face time with Wendy Ortega discussing the risks and benefits of lung cancer screening. We viewed a power point together that explained in detail the above noted topics. We paused at intervals to allow for questions to be asked and answered to ensure understanding.We discussed that the single most powerful action that she  can take to decrease her risk of developing lung cancer is to quit smoking. We discussed whether or not she is ready to commit to setting a quit date. She is currently not ready to set a quit date. We discussed options for tools to aid in quitting smoking including nicotine replacement therapy, non-nicotine medications, support groups, Quit Smart classes, and behavior modification. We discussed that often times setting smaller, more achievable goals, such as eliminating 1 cigarette a day for a week and then 2 cigarettes a day for a week can be helpful in slowly decreasing the number of cigarettes smoked. This allows for a sense of accomplishment as well as providing a clinical benefit. I gave her  the " Be Stronger Than Your Excuses" card with contact information for community resources, classes, free nicotine replacement therapy, and access to mobile apps, text messaging, and on-line smoking cessation help. I have also given my  my card and contact information in the event she needs to contact me. We discussed the time and location of the scan, and that either June Leap, CMA, or I will call with the results within 24-48 hours of receiving them. I have provided her with a copy of the power  point we viewed  as a resource in the event they need reinforcement of the concepts we discussed today in the office. The patient verbalized understanding of all of  the above and had no  further questions upon leaving the office. They have my contact information in the event they have any further questions.  Wendy Spatz, NP 01/18/2016

## 2016-01-20 ENCOUNTER — Telehealth: Payer: Self-pay | Admitting: Acute Care

## 2016-01-20 DIAGNOSIS — F17210 Nicotine dependence, cigarettes, uncomplicated: Principal | ICD-10-CM

## 2016-01-20 NOTE — Telephone Encounter (Signed)
I have called the results of [name]' CT chest for lung cancer screening. I explained that Wendy Ortega scan was read as a lung RADS 2, nodules that are benign in both appearance and behavior. I explained the recommendation was for repeat annual screening with low-dose chest CT without contrast in 12 months. We will call and schedule the scan for September 2018. I also explained that the scan indicated incidental finding of aortic atherosclerosis, and mild centrilobular emphysema. I explained Wendy Ortega that I will send the results of Wendy Ortega scan Wendy Ortega primary care doctor Wendy Ortega, for follow-up as he feels is indicated. She currently is not on statin medications. She  has Wendy Ortega cholesterol monitored annually by Wendy Ortega primary care physician. Wendy Ortega requested that I mail a copy of the scan results to Wendy Ortega at Wendy Ortega home.I Told Wendy Ortega that I would do so. She verbalized understanding of the above and had no further questions regarding the scan. She does have my contact information in the event she has any questions in the future.

## 2016-12-29 ENCOUNTER — Other Ambulatory Visit: Payer: Self-pay | Admitting: Internal Medicine

## 2016-12-29 DIAGNOSIS — Z1231 Encounter for screening mammogram for malignant neoplasm of breast: Secondary | ICD-10-CM

## 2017-01-05 ENCOUNTER — Ambulatory Visit: Payer: Medicare Other

## 2017-01-10 ENCOUNTER — Ambulatory Visit
Admission: RE | Admit: 2017-01-10 | Discharge: 2017-01-10 | Disposition: A | Discharge status: Home / Self Care | Payer: Medicare Other | Source: Ambulatory Visit | Attending: Internal Medicine | Admitting: Internal Medicine

## 2017-01-10 DIAGNOSIS — Z1231 Encounter for screening mammogram for malignant neoplasm of breast: Secondary | ICD-10-CM

## 2017-01-18 ENCOUNTER — Ambulatory Visit (INDEPENDENT_AMBULATORY_CARE_PROVIDER_SITE_OTHER)
Admission: RE | Admit: 2017-01-18 | Discharge: 2017-01-18 | Disposition: A | Discharge status: Home / Self Care | Payer: Medicare Other | Source: Ambulatory Visit | Attending: Acute Care | Admitting: Acute Care

## 2017-01-18 DIAGNOSIS — Z87891 Personal history of nicotine dependence: Secondary | ICD-10-CM

## 2017-01-18 DIAGNOSIS — F17210 Nicotine dependence, cigarettes, uncomplicated: Principal | ICD-10-CM

## 2017-01-27 ENCOUNTER — Other Ambulatory Visit: Payer: Self-pay | Admitting: Acute Care

## 2017-01-27 DIAGNOSIS — F17210 Nicotine dependence, cigarettes, uncomplicated: Principal | ICD-10-CM

## 2017-01-27 DIAGNOSIS — Z122 Encounter for screening for malignant neoplasm of respiratory organs: Secondary | ICD-10-CM

## 2018-01-18 ENCOUNTER — Other Ambulatory Visit: Payer: Self-pay | Admitting: Internal Medicine

## 2018-01-18 DIAGNOSIS — Z129 Encounter for screening for malignant neoplasm, site unspecified: Secondary | ICD-10-CM

## 2018-01-23 ENCOUNTER — Ambulatory Visit
Admission: RE | Admit: 2018-01-23 | Discharge: 2018-01-23 | Disposition: A | Discharge status: Home / Self Care | Payer: Medicare Other | Source: Ambulatory Visit | Attending: Internal Medicine | Admitting: Internal Medicine

## 2018-01-23 DIAGNOSIS — Z129 Encounter for screening for malignant neoplasm, site unspecified: Secondary | ICD-10-CM

## 2018-01-31 ENCOUNTER — Telehealth: Payer: Self-pay | Admitting: Acute Care

## 2018-01-31 NOTE — Telephone Encounter (Signed)
Please call patient and let them  know their  low dose Ct was read as a Lung RADS 2: nodules that are benign in appearance and behavior with a very low likelihood of becoming a clinically active cancer due to size or lack of growth. Recommendation per radiology is for a repeat LDCT in 12 months..Please let them  know we will order and schedule their  annual screening scan for 01/2019. Please let them  know there was notation of CAD on their  scan.  Please remind the patient  that this is a non-gated exam therefore degree or severity of disease  cannot be determined. Please have them  follow up with their PCP regarding potential risk factor modification, dietary therapy or pharmacologic therapy if clinically indicated. Pt.  is not  currently on statin therapy. Please place order for annual  screening scan for  01/2019 and fax results to PCP. Please let patient know there was notation of mild emphysema. Thanks so much.

## 2018-02-02 NOTE — Telephone Encounter (Signed)
LMOMTCB x 1 

## 2018-02-06 ENCOUNTER — Telehealth: Payer: Self-pay | Admitting: Acute Care

## 2018-02-06 DIAGNOSIS — Z720 Tobacco use: Secondary | ICD-10-CM

## 2018-02-06 NOTE — Telephone Encounter (Signed)
LMTC x 1  

## 2018-02-07 NOTE — Telephone Encounter (Signed)
Called and spoke to patient, made aware of CT results. Voiced understanding. Patient would like to know where the nodules are located. Order placed. Routed to PCP.   SG please advise, where are nodules located?

## 2018-02-07 NOTE — Telephone Encounter (Signed)
Wendy Ortega. These results were called by Triage. The patient is requesting to know where the nodules are located. She has multiple  scattered bilateral pulmonary nodules that are very small. The largest of which is 5.9 mm in size which is equivalent to a very small pea. Let her know the definition of LR 2. Lung RADS 2: nodules that are benign in appearance and behavior with a very low likelihood of becoming a clinically active cancer due to size or lack of growth. Recommendation per radiology is for a repeat LDCT in 12 months. Thanks so much.

## 2018-02-08 NOTE — Telephone Encounter (Signed)
Spoke with pt and reviewed LDCT chest results. PT verbalized understanding.  Nothing further needed.

## 2019-02-20 ENCOUNTER — Ambulatory Visit
Admission: RE | Admit: 2019-02-20 | Discharge: 2019-02-20 | Disposition: A | Discharge status: Home / Self Care | Payer: Medicare Other | Source: Ambulatory Visit | Attending: Acute Care | Admitting: Acute Care

## 2019-02-20 DIAGNOSIS — Z720 Tobacco use: Secondary | ICD-10-CM

## 2019-02-25 ENCOUNTER — Other Ambulatory Visit: Payer: Self-pay | Admitting: *Deleted

## 2019-02-25 DIAGNOSIS — F17210 Nicotine dependence, cigarettes, uncomplicated: Secondary | ICD-10-CM

## 2019-02-25 DIAGNOSIS — Z122 Encounter for screening for malignant neoplasm of respiratory organs: Secondary | ICD-10-CM

## 2019-02-25 DIAGNOSIS — Z87891 Personal history of nicotine dependence: Secondary | ICD-10-CM

## 2020-02-14 ENCOUNTER — Other Ambulatory Visit: Payer: Self-pay | Admitting: *Deleted

## 2020-02-14 DIAGNOSIS — Z87891 Personal history of nicotine dependence: Secondary | ICD-10-CM

## 2020-02-14 DIAGNOSIS — F17210 Nicotine dependence, cigarettes, uncomplicated: Secondary | ICD-10-CM

## 2020-03-23 ENCOUNTER — Ambulatory Visit: Payer: Medicare Other

## 2020-05-08 DIAGNOSIS — M13841 Other specified arthritis, right hand: Secondary | ICD-10-CM | POA: Diagnosis not present

## 2020-05-08 DIAGNOSIS — M79641 Pain in right hand: Secondary | ICD-10-CM | POA: Diagnosis not present

## 2020-06-29 DIAGNOSIS — Z01812 Encounter for preprocedural laboratory examination: Secondary | ICD-10-CM | POA: Diagnosis not present

## 2020-07-02 DIAGNOSIS — K621 Rectal polyp: Secondary | ICD-10-CM | POA: Diagnosis not present

## 2020-07-02 DIAGNOSIS — D123 Benign neoplasm of transverse colon: Secondary | ICD-10-CM | POA: Diagnosis not present

## 2020-07-02 DIAGNOSIS — Z86010 Personal history of colonic polyps: Secondary | ICD-10-CM | POA: Diagnosis not present

## 2020-07-02 DIAGNOSIS — K5730 Diverticulosis of large intestine without perforation or abscess without bleeding: Secondary | ICD-10-CM | POA: Diagnosis not present

## 2020-07-02 DIAGNOSIS — K5520 Angiodysplasia of colon without hemorrhage: Secondary | ICD-10-CM | POA: Diagnosis not present

## 2020-07-02 DIAGNOSIS — Z800 Family history of malignant neoplasm of digestive organs: Secondary | ICD-10-CM | POA: Diagnosis not present

## 2020-07-02 DIAGNOSIS — D122 Benign neoplasm of ascending colon: Secondary | ICD-10-CM | POA: Diagnosis not present

## 2020-07-02 DIAGNOSIS — D120 Benign neoplasm of cecum: Secondary | ICD-10-CM | POA: Diagnosis not present

## 2020-07-07 DIAGNOSIS — K621 Rectal polyp: Secondary | ICD-10-CM | POA: Diagnosis not present

## 2020-07-07 DIAGNOSIS — D120 Benign neoplasm of cecum: Secondary | ICD-10-CM | POA: Diagnosis not present

## 2020-07-07 DIAGNOSIS — D123 Benign neoplasm of transverse colon: Secondary | ICD-10-CM | POA: Diagnosis not present

## 2020-07-07 DIAGNOSIS — D122 Benign neoplasm of ascending colon: Secondary | ICD-10-CM | POA: Diagnosis not present

## 2020-09-02 DIAGNOSIS — H353131 Nonexudative age-related macular degeneration, bilateral, early dry stage: Secondary | ICD-10-CM | POA: Diagnosis not present

## 2020-09-02 DIAGNOSIS — H52202 Unspecified astigmatism, left eye: Secondary | ICD-10-CM | POA: Diagnosis not present

## 2020-09-02 DIAGNOSIS — Z961 Presence of intraocular lens: Secondary | ICD-10-CM | POA: Diagnosis not present

## 2020-10-21 DIAGNOSIS — H00014 Hordeolum externum left upper eyelid: Secondary | ICD-10-CM | POA: Diagnosis not present

## 2021-01-06 DIAGNOSIS — Z85828 Personal history of other malignant neoplasm of skin: Secondary | ICD-10-CM | POA: Diagnosis not present

## 2021-01-06 DIAGNOSIS — L820 Inflamed seborrheic keratosis: Secondary | ICD-10-CM | POA: Diagnosis not present

## 2021-01-06 DIAGNOSIS — L570 Actinic keratosis: Secondary | ICD-10-CM | POA: Diagnosis not present

## 2021-01-06 DIAGNOSIS — L821 Other seborrheic keratosis: Secondary | ICD-10-CM | POA: Diagnosis not present

## 2021-03-15 DIAGNOSIS — M549 Dorsalgia, unspecified: Secondary | ICD-10-CM | POA: Diagnosis not present

## 2021-03-15 DIAGNOSIS — J449 Chronic obstructive pulmonary disease, unspecified: Secondary | ICD-10-CM | POA: Diagnosis not present

## 2021-03-15 DIAGNOSIS — F17200 Nicotine dependence, unspecified, uncomplicated: Secondary | ICD-10-CM | POA: Diagnosis not present

## 2021-03-15 DIAGNOSIS — E559 Vitamin D deficiency, unspecified: Secondary | ICD-10-CM | POA: Diagnosis not present

## 2021-03-15 DIAGNOSIS — I10 Essential (primary) hypertension: Secondary | ICD-10-CM | POA: Diagnosis not present

## 2021-03-15 DIAGNOSIS — Z1211 Encounter for screening for malignant neoplasm of colon: Secondary | ICD-10-CM | POA: Diagnosis not present

## 2021-03-15 DIAGNOSIS — R202 Paresthesia of skin: Secondary | ICD-10-CM | POA: Diagnosis not present

## 2021-03-15 DIAGNOSIS — Z7189 Other specified counseling: Secondary | ICD-10-CM | POA: Diagnosis not present

## 2021-03-15 DIAGNOSIS — Z0000 Encounter for general adult medical examination without abnormal findings: Secondary | ICD-10-CM | POA: Diagnosis not present

## 2021-03-15 DIAGNOSIS — L820 Inflamed seborrheic keratosis: Secondary | ICD-10-CM | POA: Diagnosis not present

## 2021-03-15 DIAGNOSIS — Z1389 Encounter for screening for other disorder: Secondary | ICD-10-CM | POA: Diagnosis not present

## 2021-03-15 DIAGNOSIS — G629 Polyneuropathy, unspecified: Secondary | ICD-10-CM | POA: Diagnosis not present

## 2021-03-15 DIAGNOSIS — Z23 Encounter for immunization: Secondary | ICD-10-CM | POA: Diagnosis not present

## 2021-04-06 DIAGNOSIS — M25572 Pain in left ankle and joints of left foot: Secondary | ICD-10-CM | POA: Diagnosis not present

## 2021-04-08 DIAGNOSIS — L738 Other specified follicular disorders: Secondary | ICD-10-CM | POA: Diagnosis not present

## 2021-04-08 DIAGNOSIS — Z85828 Personal history of other malignant neoplasm of skin: Secondary | ICD-10-CM | POA: Diagnosis not present

## 2021-04-08 DIAGNOSIS — L821 Other seborrheic keratosis: Secondary | ICD-10-CM | POA: Diagnosis not present

## 2021-04-08 DIAGNOSIS — L720 Epidermal cyst: Secondary | ICD-10-CM | POA: Diagnosis not present

## 2021-05-11 DIAGNOSIS — R0981 Nasal congestion: Secondary | ICD-10-CM | POA: Diagnosis not present

## 2021-05-11 DIAGNOSIS — R059 Cough, unspecified: Secondary | ICD-10-CM | POA: Diagnosis not present

## 2021-05-11 DIAGNOSIS — J449 Chronic obstructive pulmonary disease, unspecified: Secondary | ICD-10-CM | POA: Diagnosis not present

## 2021-05-24 DIAGNOSIS — K121 Other forms of stomatitis: Secondary | ICD-10-CM | POA: Diagnosis not present

## 2021-05-24 DIAGNOSIS — R197 Diarrhea, unspecified: Secondary | ICD-10-CM | POA: Diagnosis not present

## 2021-05-24 DIAGNOSIS — Z86010 Personal history of colonic polyps: Secondary | ICD-10-CM | POA: Diagnosis not present

## 2021-06-25 DIAGNOSIS — D122 Benign neoplasm of ascending colon: Secondary | ICD-10-CM | POA: Diagnosis not present

## 2021-06-25 DIAGNOSIS — D123 Benign neoplasm of transverse colon: Secondary | ICD-10-CM | POA: Diagnosis not present

## 2021-06-25 DIAGNOSIS — D120 Benign neoplasm of cecum: Secondary | ICD-10-CM | POA: Diagnosis not present

## 2021-06-25 DIAGNOSIS — Z86010 Personal history of colonic polyps: Secondary | ICD-10-CM | POA: Diagnosis not present

## 2021-06-25 DIAGNOSIS — D128 Benign neoplasm of rectum: Secondary | ICD-10-CM | POA: Diagnosis not present

## 2021-06-25 DIAGNOSIS — K5520 Angiodysplasia of colon without hemorrhage: Secondary | ICD-10-CM | POA: Diagnosis not present

## 2021-06-29 DIAGNOSIS — D123 Benign neoplasm of transverse colon: Secondary | ICD-10-CM | POA: Diagnosis not present

## 2021-06-29 DIAGNOSIS — D122 Benign neoplasm of ascending colon: Secondary | ICD-10-CM | POA: Diagnosis not present

## 2021-07-30 IMAGING — CT CT CHEST LUNG CANCER SCREENING LOW DOSE W/O CM
2 of 5 series · 14 of 40 positions shown, 17 images · non-contrast
Comparison: Low-dose lung cancer screening chest CT 01/23/2018.

CLINICAL DATA: 73-year-old female current smoker with 39 pack year
history of smoking. Lung cancer screening examination.

EXAM:
CT CHEST WITHOUT CONTRAST LOW-DOSE FOR LUNG CANCER SCREENING
TECHNIQUE: Multidetector CT imaging of the chest was performed following the
standard protocol without IV contrast.

[Series 4: lung 1.00 br44 cor · coronal · 0.59mm/px · 3 of 309 slices shown]
[im 62/309  lung]
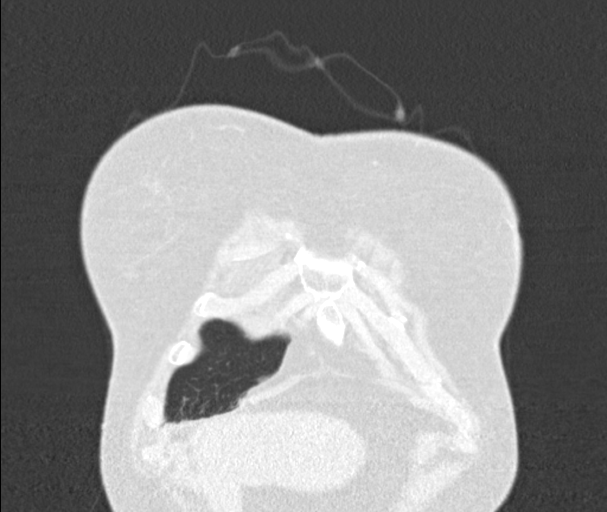
[im 124/309  lung]
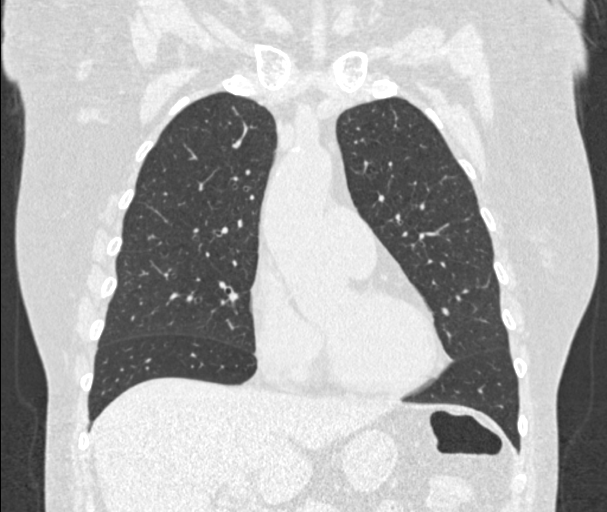
[im 185/309  lung]
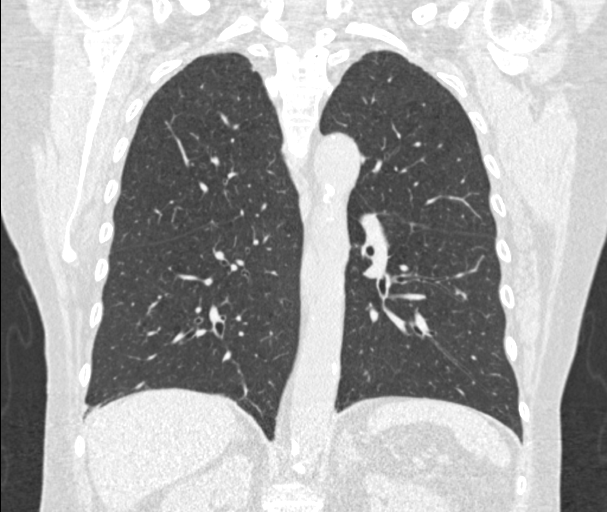

[Series 9: lung 1.00 br60 axial · axial · 0.70mm/px · z∈[-936,-661]mm · 11 of 303 slices shown, 14 images]
[im 14/303  mediastinal]
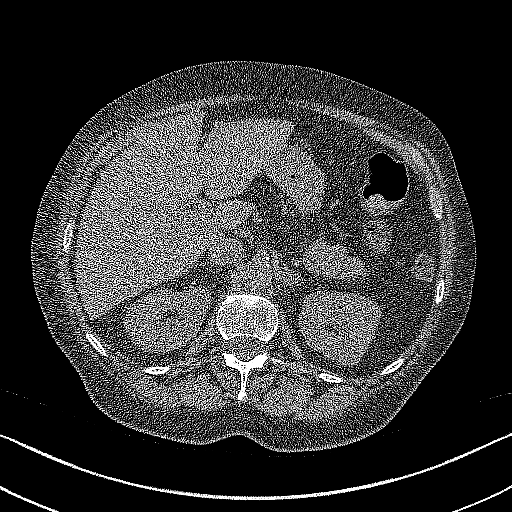
[im 14/303  lung]
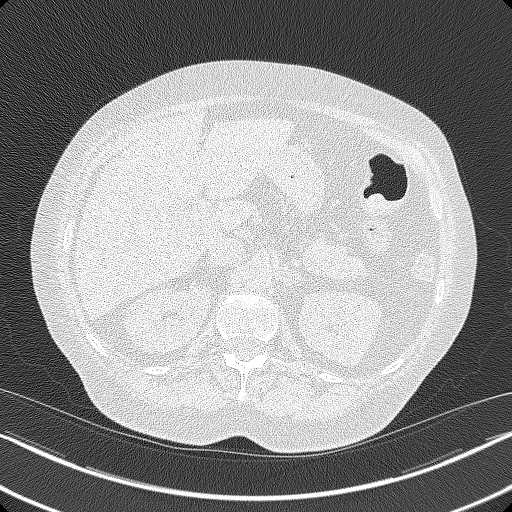
[im 42/303  lung]
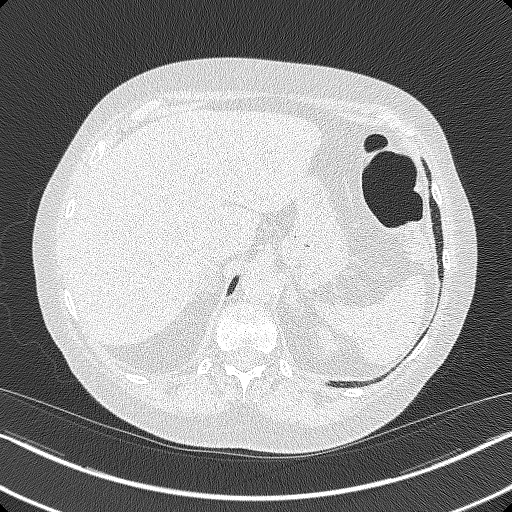
[im 69/303  lung]
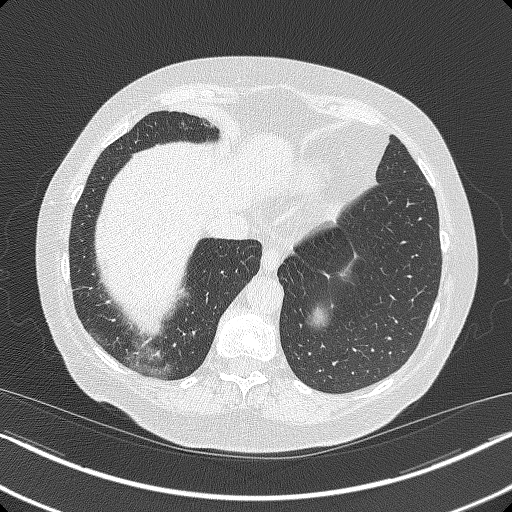
[im 97/303  lung]
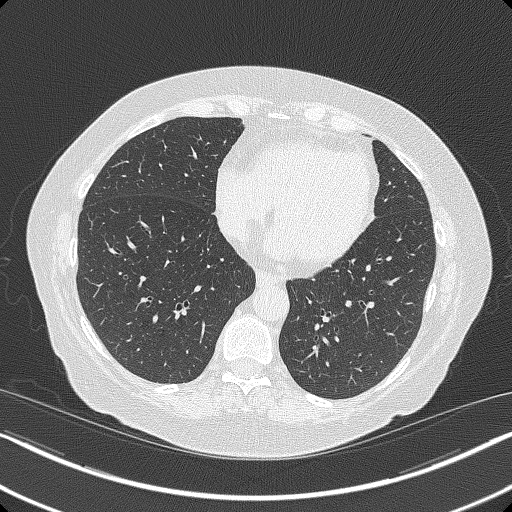
[im 124/303  mediastinal]
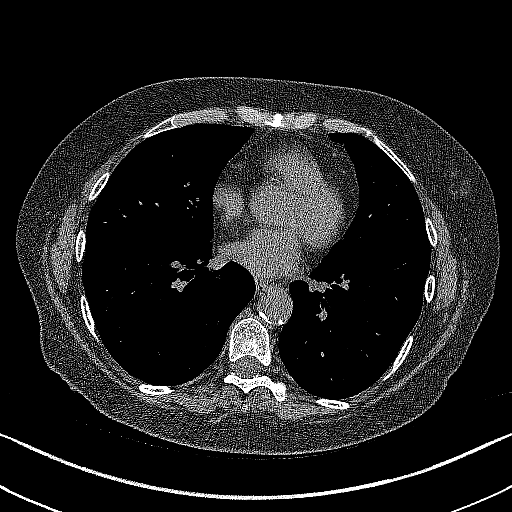
[im 124/303  lung]
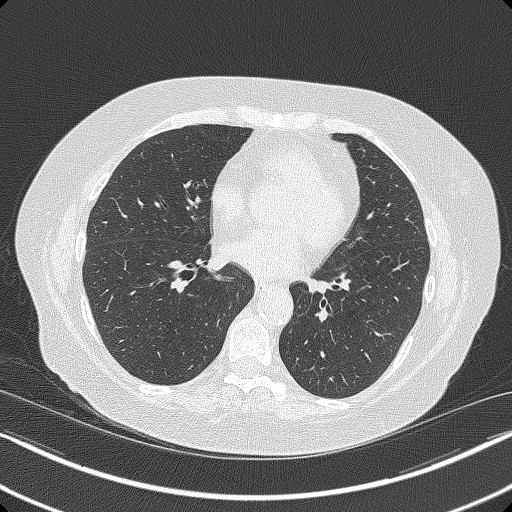
[im 152/303  lung]
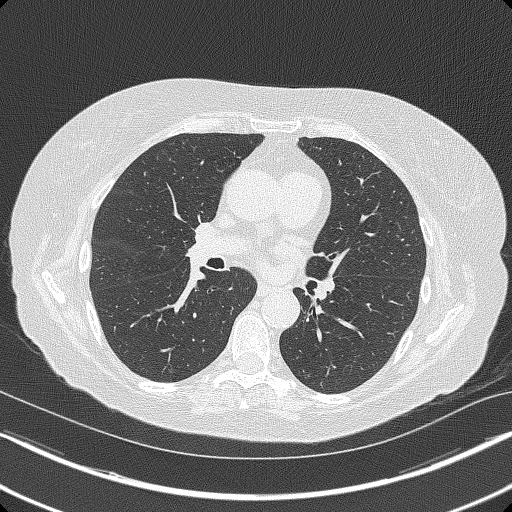
[im 179/303  lung]
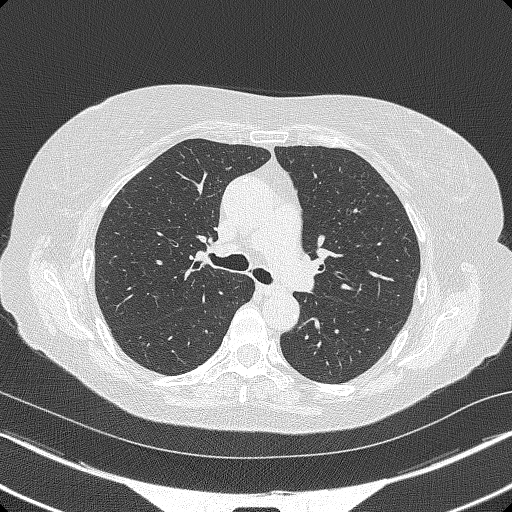
[im 206/303  lung]
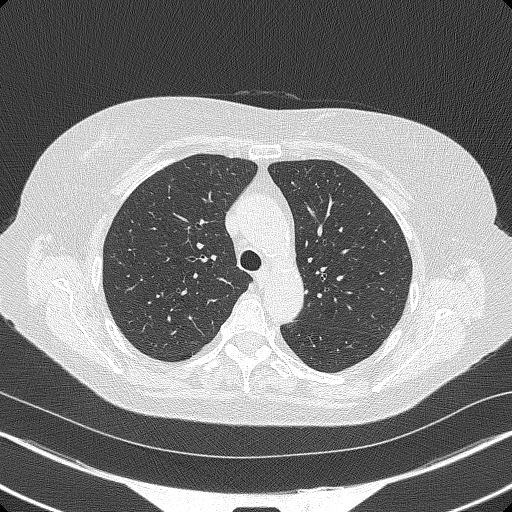
[im 234/303  mediastinal]
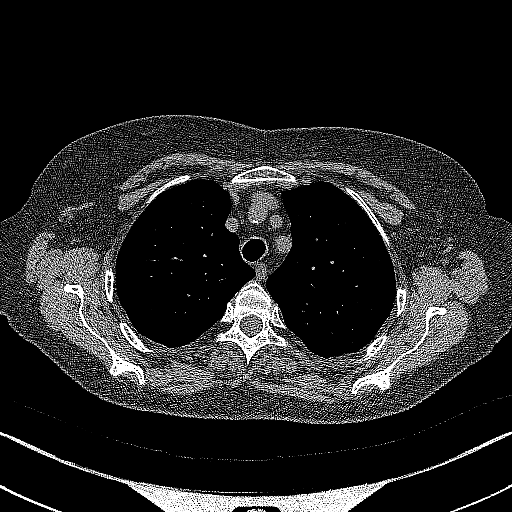
[im 234/303  lung]
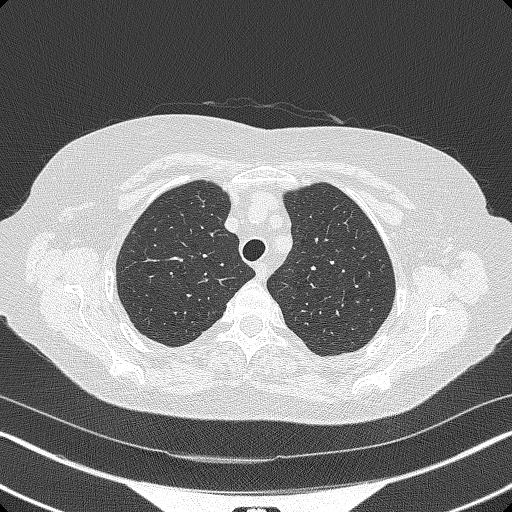
[im 261/303  lung]
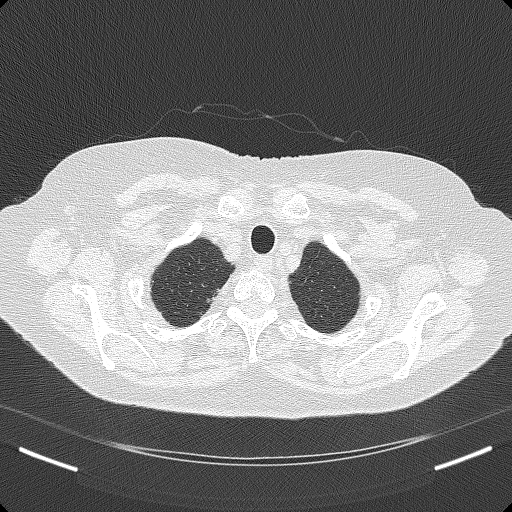
[im 289/303  lung]
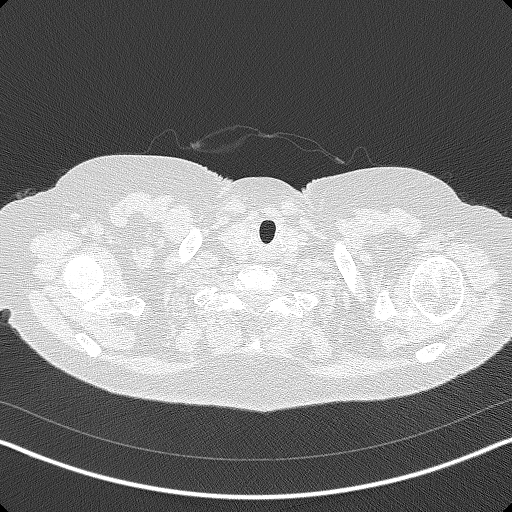

[14 of 40 positions shown; findings below may reference images not displayed]

FINDINGS: Cardiovascular: Heart size is normal. There is no significant
pericardial fluid, thickening or pericardial calcification. There is
aortic atherosclerosis, as well as atherosclerosis of the great
vessels of the mediastinum and the coronary arteries, including
calcified atherosclerotic plaque in the left anterior descending and
right coronary arteries.

Mediastinum/Nodes: No pathologically enlarged mediastinal or hilar
lymph nodes. Please note that accurate exclusion of hilar adenopathy
is limited on noncontrast CT scans. Esophagus is unremarkable in
appearance. No axillary lymphadenopathy.

Lungs/Pleura: Multiple small pulmonary nodules are again noted
throughout the lungs bilaterally, largest of which is in the
periphery of the left lower lobe (axial image 159 of series 3), with
a volume derived mean diameter 5.8 mm. No larger more suspicious
appearing pulmonary nodules or masses are noted. No acute
consolidative airspace disease. No pleural effusions. Diffuse
bronchial wall thickening with mild centrilobular and paraseptal
emphysema.

Upper Abdomen: Low-attenuation nodules in both adrenal glands,
compatible with adenomas, measuring up to 1.8 x 1.5 cm on the left.
Aortic atherosclerosis.

Musculoskeletal: There are no aggressive appearing lytic or blastic
lesions noted in the visualized portions of the skeleton.
IMPRESSION: 1. Lung-RADS 2S, benign appearance or behavior. Continue annual
screening with low-dose chest CT without contrast in 12 months.
2. The "S" modifier above refers to potentially clinically
significant non lung cancer related findings. Specifically, there is
aortic atherosclerosis, in addition to 2 vessel coronary artery
disease. Please note that although the presence of coronary artery
calcium documents the presence of coronary artery disease, the
severity of this disease and any potential stenosis cannot be
assessed on this non-gated CT examination. Assessment for potential
risk factor modification, dietary therapy or pharmacologic therapy
may be warranted, if clinically indicated.
3. Mild diffuse bronchial wall thickening with mild centrilobular
and paraseptal emphysema; imaging findings suggestive of underlying
COPD.
4. Small bilateral adrenal adenomas, similar to the prior study.

Aortic Atherosclerosis (P3W03-DGK.K) and Emphysema (P3W03-NVW.X).

## 2021-09-07 DIAGNOSIS — M533 Sacrococcygeal disorders, not elsewhere classified: Secondary | ICD-10-CM | POA: Diagnosis not present

## 2021-09-07 DIAGNOSIS — M545 Low back pain, unspecified: Secondary | ICD-10-CM | POA: Diagnosis not present

## 2021-09-08 DIAGNOSIS — Z961 Presence of intraocular lens: Secondary | ICD-10-CM | POA: Diagnosis not present

## 2021-09-08 DIAGNOSIS — H52203 Unspecified astigmatism, bilateral: Secondary | ICD-10-CM | POA: Diagnosis not present

## 2021-09-08 DIAGNOSIS — H353131 Nonexudative age-related macular degeneration, bilateral, early dry stage: Secondary | ICD-10-CM | POA: Diagnosis not present

## 2022-05-17 ENCOUNTER — Other Ambulatory Visit: Payer: Self-pay | Admitting: Internal Medicine

## 2022-05-17 DIAGNOSIS — R0600 Dyspnea, unspecified: Secondary | ICD-10-CM

## 2022-05-18 ENCOUNTER — Ambulatory Visit (INDEPENDENT_AMBULATORY_CARE_PROVIDER_SITE_OTHER): Payer: Medicare Other | Admitting: Internal Medicine

## 2022-05-18 DIAGNOSIS — R0600 Dyspnea, unspecified: Secondary | ICD-10-CM

## 2022-05-18 LAB — PULMONARY FUNCTION TEST
DL/VA % pred: 97 %
DL/VA: 4.02 ml/min/mmHg/L
DLCO cor % pred: 81 %
DLCO cor: 15.36 ml/min/mmHg
DLCO unc % pred: 81 %
DLCO unc: 15.36 ml/min/mmHg
FEF 25-75 Post: 2.23 L/sec
FEF 25-75 Pre: 0.94 L/sec
FEF2575-%Change-Post: 136 %
FEF2575-%Pred-Post: 136 %
FEF2575-%Pred-Pre: 57 %
FEV1-%Change-Post: 25 %
FEV1-%Pred-Post: 85 %
FEV1-%Pred-Pre: 67 %
FEV1-Post: 1.76 L
FEV1-Pre: 1.4 L
FEV1FVC-%Change-Post: 0 %
FEV1FVC-%Pred-Pre: 95 %
FEV6-%Change-Post: 24 %
FEV6-%Pred-Post: 92 %
FEV6-%Pred-Pre: 74 %
FEV6-Post: 2.41 L
FEV6-Pre: 1.94 L
FEV6FVC-%Change-Post: 0 %
FEV6FVC-%Pred-Post: 103 %
FEV6FVC-%Pred-Pre: 104 %
FVC-%Change-Post: 24 %
FVC-%Pred-Post: 88 %
FVC-%Pred-Pre: 71 %
FVC-Post: 2.44 L
FVC-Pre: 1.96 L
Post FEV1/FVC ratio: 72 %
Post FEV6/FVC ratio: 99 %
Pre FEV1/FVC ratio: 71 %
Pre FEV6/FVC Ratio: 99 %
RV % pred: 146 %
RV: 3.31 L
TLC % pred: 114 %
TLC: 5.7 L

## 2022-05-18 NOTE — Progress Notes (Signed)
PFT done today. 

## 2022-05-30 DIAGNOSIS — H81393 Other peripheral vertigo, bilateral: Secondary | ICD-10-CM | POA: Diagnosis not present

## 2022-05-30 DIAGNOSIS — Z780 Asymptomatic menopausal state: Secondary | ICD-10-CM | POA: Diagnosis not present

## 2022-05-30 DIAGNOSIS — J449 Chronic obstructive pulmonary disease, unspecified: Secondary | ICD-10-CM | POA: Diagnosis not present

## 2022-05-30 DIAGNOSIS — Z79899 Other long term (current) drug therapy: Secondary | ICD-10-CM | POA: Diagnosis not present

## 2022-05-30 DIAGNOSIS — Z0000 Encounter for general adult medical examination without abnormal findings: Secondary | ICD-10-CM | POA: Diagnosis not present

## 2022-05-30 DIAGNOSIS — J439 Emphysema, unspecified: Secondary | ICD-10-CM | POA: Diagnosis not present

## 2022-05-30 DIAGNOSIS — E559 Vitamin D deficiency, unspecified: Secondary | ICD-10-CM | POA: Diagnosis not present

## 2022-05-30 DIAGNOSIS — R0781 Pleurodynia: Secondary | ICD-10-CM | POA: Diagnosis not present

## 2022-05-30 DIAGNOSIS — E7800 Pure hypercholesterolemia, unspecified: Secondary | ICD-10-CM | POA: Diagnosis not present

## 2022-05-30 DIAGNOSIS — I10 Essential (primary) hypertension: Secondary | ICD-10-CM | POA: Diagnosis not present

## 2022-06-09 ENCOUNTER — Other Ambulatory Visit: Payer: Self-pay | Admitting: Internal Medicine

## 2022-06-09 DIAGNOSIS — Z122 Encounter for screening for malignant neoplasm of respiratory organs: Secondary | ICD-10-CM

## 2022-06-10 ENCOUNTER — Other Ambulatory Visit: Payer: Self-pay | Admitting: Internal Medicine

## 2022-06-10 DIAGNOSIS — Z780 Asymptomatic menopausal state: Secondary | ICD-10-CM

## 2022-07-05 DIAGNOSIS — H26492 Other secondary cataract, left eye: Secondary | ICD-10-CM | POA: Diagnosis not present

## 2022-07-05 DIAGNOSIS — H353132 Nonexudative age-related macular degeneration, bilateral, intermediate dry stage: Secondary | ICD-10-CM | POA: Diagnosis not present

## 2022-07-06 ENCOUNTER — Ambulatory Visit
Admission: RE | Admit: 2022-07-06 | Discharge: 2022-07-06 | Disposition: A | Discharge status: Home / Self Care | Payer: Medicare Other | Source: Ambulatory Visit | Attending: Internal Medicine | Admitting: Internal Medicine

## 2022-07-06 DIAGNOSIS — Z122 Encounter for screening for malignant neoplasm of respiratory organs: Secondary | ICD-10-CM

## 2022-07-06 DIAGNOSIS — F17210 Nicotine dependence, cigarettes, uncomplicated: Secondary | ICD-10-CM | POA: Diagnosis not present

## 2022-07-06 DIAGNOSIS — H26492 Other secondary cataract, left eye: Secondary | ICD-10-CM | POA: Diagnosis not present

## 2022-11-16 ENCOUNTER — Inpatient Hospital Stay: Admission: RE | Admit: 2022-11-16 | Payer: Medicare Other | Source: Ambulatory Visit

## 2022-12-29 DIAGNOSIS — D2271 Melanocytic nevi of right lower limb, including hip: Secondary | ICD-10-CM | POA: Diagnosis not present

## 2022-12-29 DIAGNOSIS — Z85828 Personal history of other malignant neoplasm of skin: Secondary | ICD-10-CM | POA: Diagnosis not present

## 2022-12-29 DIAGNOSIS — I2510 Atherosclerotic heart disease of native coronary artery without angina pectoris: Secondary | ICD-10-CM | POA: Diagnosis not present

## 2022-12-29 DIAGNOSIS — L853 Xerosis cutis: Secondary | ICD-10-CM | POA: Diagnosis not present

## 2022-12-29 DIAGNOSIS — R1084 Generalized abdominal pain: Secondary | ICD-10-CM | POA: Diagnosis not present

## 2022-12-29 DIAGNOSIS — L820 Inflamed seborrheic keratosis: Secondary | ICD-10-CM | POA: Diagnosis not present

## 2022-12-29 DIAGNOSIS — I700 Atherosclerosis of aorta: Secondary | ICD-10-CM | POA: Diagnosis not present

## 2022-12-29 DIAGNOSIS — R3129 Other microscopic hematuria: Secondary | ICD-10-CM | POA: Diagnosis not present

## 2022-12-29 DIAGNOSIS — D2262 Melanocytic nevi of left upper limb, including shoulder: Secondary | ICD-10-CM | POA: Diagnosis not present

## 2022-12-29 DIAGNOSIS — L821 Other seborrheic keratosis: Secondary | ICD-10-CM | POA: Diagnosis not present

## 2023-01-02 ENCOUNTER — Other Ambulatory Visit: Payer: Self-pay | Admitting: Internal Medicine

## 2023-01-02 DIAGNOSIS — R1084 Generalized abdominal pain: Secondary | ICD-10-CM

## 2023-01-05 ENCOUNTER — Ambulatory Visit
Admission: RE | Admit: 2023-01-05 | Discharge: 2023-01-05 | Disposition: A | Discharge status: Home / Self Care | Payer: Medicare Other | Source: Ambulatory Visit | Attending: Internal Medicine | Admitting: Internal Medicine

## 2023-01-05 DIAGNOSIS — R1084 Generalized abdominal pain: Secondary | ICD-10-CM | POA: Diagnosis not present

## 2023-01-05 MED ORDER — IOPAMIDOL (ISOVUE-300) INJECTION 61%
200.0000 mL | Freq: Once | INTRAVENOUS | Status: AC | PRN
  Administered 2023-01-05: 100 mL via INTRAVENOUS

## 2023-06-01 ENCOUNTER — Other Ambulatory Visit: Payer: Self-pay | Admitting: Internal Medicine

## 2023-06-01 ENCOUNTER — Ambulatory Visit
Admission: RE | Admit: 2023-06-01 | Discharge: 2023-06-01 | Disposition: A | Discharge status: Home / Self Care | Payer: Medicare Other | Source: Ambulatory Visit | Attending: Internal Medicine | Admitting: Internal Medicine

## 2023-06-01 DIAGNOSIS — R1084 Generalized abdominal pain: Secondary | ICD-10-CM | POA: Diagnosis not present

## 2023-06-01 DIAGNOSIS — Z1231 Encounter for screening mammogram for malignant neoplasm of breast: Secondary | ICD-10-CM | POA: Diagnosis not present

## 2023-06-01 DIAGNOSIS — Z0000 Encounter for general adult medical examination without abnormal findings: Secondary | ICD-10-CM

## 2023-06-01 DIAGNOSIS — I10 Essential (primary) hypertension: Secondary | ICD-10-CM | POA: Diagnosis not present

## 2023-06-01 DIAGNOSIS — E7800 Pure hypercholesterolemia, unspecified: Secondary | ICD-10-CM | POA: Diagnosis not present

## 2023-06-01 DIAGNOSIS — J449 Chronic obstructive pulmonary disease, unspecified: Secondary | ICD-10-CM | POA: Diagnosis not present

## 2023-06-01 DIAGNOSIS — I2510 Atherosclerotic heart disease of native coronary artery without angina pectoris: Secondary | ICD-10-CM | POA: Diagnosis not present

## 2023-06-01 DIAGNOSIS — Z780 Asymptomatic menopausal state: Secondary | ICD-10-CM | POA: Diagnosis not present

## 2023-06-01 DIAGNOSIS — Z23 Encounter for immunization: Secondary | ICD-10-CM | POA: Diagnosis not present

## 2023-06-01 DIAGNOSIS — Z79899 Other long term (current) drug therapy: Secondary | ICD-10-CM | POA: Diagnosis not present

## 2023-06-01 DIAGNOSIS — E559 Vitamin D deficiency, unspecified: Secondary | ICD-10-CM | POA: Diagnosis not present

## 2023-06-01 DIAGNOSIS — Z122 Encounter for screening for malignant neoplasm of respiratory organs: Secondary | ICD-10-CM

## 2023-06-01 DIAGNOSIS — R42 Dizziness and giddiness: Secondary | ICD-10-CM | POA: Diagnosis not present

## 2023-07-12 ENCOUNTER — Ambulatory Visit
Admission: RE | Admit: 2023-07-12 | Discharge: 2023-07-12 | Disposition: A | Discharge status: Home / Self Care | Payer: Medicare Other | Source: Ambulatory Visit | Attending: Internal Medicine | Admitting: Internal Medicine

## 2023-07-12 DIAGNOSIS — H52203 Unspecified astigmatism, bilateral: Secondary | ICD-10-CM | POA: Diagnosis not present

## 2023-07-12 DIAGNOSIS — H353132 Nonexudative age-related macular degeneration, bilateral, intermediate dry stage: Secondary | ICD-10-CM | POA: Diagnosis not present

## 2023-07-12 DIAGNOSIS — Z122 Encounter for screening for malignant neoplasm of respiratory organs: Secondary | ICD-10-CM

## 2023-07-12 DIAGNOSIS — Z87891 Personal history of nicotine dependence: Secondary | ICD-10-CM | POA: Diagnosis not present

## 2023-07-12 DIAGNOSIS — Z961 Presence of intraocular lens: Secondary | ICD-10-CM | POA: Diagnosis not present

## 2023-08-16 DIAGNOSIS — Z79899 Other long term (current) drug therapy: Secondary | ICD-10-CM | POA: Diagnosis not present

## 2023-08-25 ENCOUNTER — Other Ambulatory Visit: Payer: Medicare Other

## 2023-09-01 DIAGNOSIS — M25561 Pain in right knee: Secondary | ICD-10-CM | POA: Diagnosis not present

## 2023-09-20 DIAGNOSIS — M25561 Pain in right knee: Secondary | ICD-10-CM | POA: Diagnosis not present

## 2023-11-03 ENCOUNTER — Telehealth: Payer: Self-pay | Admitting: Pharmacist

## 2023-11-03 NOTE — Progress Notes (Signed)
   11/03/2023  Patient ID: Wendy Ortega, female   DOB: 04/21/1946, 78 y.o.   MRN: [id number]  Patient appeared on insurance report for at-risk for failing 2025 metric: Medication Adherence for Cholesterol (MAC)   Medication: Rosuvastatin 5mg  Last fill date: 08/17/23  Fail date: 10/31/23  Due to patient having 2 fills already and fail date passed, patient fails the metric for 2025. Will call the pharmacy and have the medication refilled.   Aloysius Lewis, PharmD [facility]  Phone Number: [phone]

## 2024-01-25 DIAGNOSIS — D1801 Hemangioma of skin and subcutaneous tissue: Secondary | ICD-10-CM | POA: Diagnosis not present

## 2024-01-25 DIAGNOSIS — Z85828 Personal history of other malignant neoplasm of skin: Secondary | ICD-10-CM | POA: Diagnosis not present

## 2024-01-25 DIAGNOSIS — L821 Other seborrheic keratosis: Secondary | ICD-10-CM | POA: Diagnosis not present

## 2024-01-25 DIAGNOSIS — L820 Inflamed seborrheic keratosis: Secondary | ICD-10-CM | POA: Diagnosis not present

## 2024-01-25 DIAGNOSIS — L812 Freckles: Secondary | ICD-10-CM | POA: Diagnosis not present

## 2024-01-25 DIAGNOSIS — L570 Actinic keratosis: Secondary | ICD-10-CM | POA: Diagnosis not present

## 2024-03-05 ENCOUNTER — Ambulatory Visit (HOSPITAL_BASED_OUTPATIENT_CLINIC_OR_DEPARTMENT_OTHER)
Admission: RE | Admit: 2024-03-05 | Discharge: 2024-03-05 | Disposition: A | Discharge status: Home / Self Care | Source: Ambulatory Visit | Attending: Internal Medicine | Admitting: Internal Medicine

## 2024-03-05 DIAGNOSIS — Z780 Asymptomatic menopausal state: Secondary | ICD-10-CM | POA: Insufficient documentation

## 2024-05-01 ENCOUNTER — Other Ambulatory Visit: Payer: Self-pay | Admitting: Internal Medicine

## 2024-05-01 DIAGNOSIS — Z1231 Encounter for screening mammogram for malignant neoplasm of breast: Secondary | ICD-10-CM

## 2024-05-14 ENCOUNTER — Other Ambulatory Visit

## 2024-06-06 ENCOUNTER — Ambulatory Visit
